# Patient Record
Sex: Male | Born: 1970 | Race: White | Hispanic: No | Marital: Single | Smoking: Current some day smoker
Health system: Southern US, Community
[De-identification: ages and names within clinical notes are randomized; demographics above are authoritative.]

## PROBLEM LIST (undated history)

## (undated) DIAGNOSIS — R112 Nausea with vomiting, unspecified: Secondary | ICD-10-CM

## (undated) DIAGNOSIS — T4145XA Adverse effect of unspecified anesthetic, initial encounter: Secondary | ICD-10-CM

## (undated) DIAGNOSIS — F419 Anxiety disorder, unspecified: Secondary | ICD-10-CM

## (undated) DIAGNOSIS — T8859XA Other complications of anesthesia, initial encounter: Secondary | ICD-10-CM

## (undated) DIAGNOSIS — Z9889 Other specified postprocedural states: Secondary | ICD-10-CM

## (undated) DIAGNOSIS — K219 Gastro-esophageal reflux disease without esophagitis: Secondary | ICD-10-CM

## (undated) HISTORY — PX: APPENDECTOMY: SHX54

---

## 1999-04-28 ENCOUNTER — Emergency Department (HOSPITAL_COMMUNITY): Admission: EM | Admit: 1999-04-28 | Discharge: 1999-04-28 | Payer: Self-pay | Admitting: Emergency Medicine

## 2001-09-04 ENCOUNTER — Emergency Department (HOSPITAL_COMMUNITY): Admission: EM | Admit: 2001-09-04 | Discharge: 2001-09-04 | Payer: Self-pay | Admitting: Emergency Medicine

## 2005-08-25 ENCOUNTER — Encounter (INDEPENDENT_AMBULATORY_CARE_PROVIDER_SITE_OTHER): Payer: Self-pay | Admitting: Specialist

## 2005-08-25 ENCOUNTER — Inpatient Hospital Stay (HOSPITAL_COMMUNITY): Admission: EM | Admit: 2005-08-25 | Discharge: 2005-08-27 | Payer: Self-pay | Admitting: Emergency Medicine

## 2009-01-22 ENCOUNTER — Encounter: Admission: RE | Admit: 2009-01-22 | Discharge: 2009-01-22 | Payer: Self-pay | Admitting: *Deleted

## 2010-05-27 NOTE — Op Note (Signed)
Brent Krueger, Brent Krueger                 ACCOUNT NO.:  [id number]   MEDICAL RECORD NO.:  [id number]          PATIENT TYPE:  INP   LOCATION:  0103                         FACILITY:  [facility]   PHYSICIAN:  Adolph Pollack, M.D.DATE OF BIRTH:  07/15/1970   DATE OF PROCEDURE:  08/25/2005  DATE OF DISCHARGE:                                 OPERATIVE REPORT   PREOPERATIVE DIAGNOSIS:  Acute appendicitis.   POSTOPERATIVE DIAGNOSIS:  Acute appendicitis.   PROCEDURE:  Laparoscopic appendectomy.   SURGEON:  Dr. Abbey Chatters   ANESTHESIA:  General.   INDICATIONS:  This 40 year old male has had abdominal pain for about 10  days, and it radiated to the right lower quadrant and became worse.  He was  found to have acute appendicitis on outpatient CT scan and has come to the  hospital, has been admitted, and is now going to be taken to the operating  room for a laparoscopic and possible open appendectomy.   TECHNIQUE:  He is brought to the room, placed supine on the operating table,  and general anesthetic was administered.  Foley catheter was placed in the  bladder.  The hair on the abdominal wall was clipped and the area sterilely  prepped and draped.  Dilute Marcaine solution was infiltrated in the  subumbilical region.  A subumbilical incision was made through the skin,  subcutaneous tissue, fascial layers, and peritoneum.  A pursestring suture  of 0 Vicryl was placed around the fascial edges.  A Hassan trocar was  introduced into the peritoneal cavity.  Pneumoperitoneum was created by  insufflation of CO2 gas.   Next, the laparoscope was introduced.  A 5 mm trocar was placed in the left  lower abdomen area.  Using blunt dissection, I noticed the appendix was  acutely inflamed, somewhat adherent to the right lower quadrant abdominal  wall, and I was able to free this up from the right lower quadrant abdominal  wall using blunt dissection.  The antimesenteric fat of the distal terminal  ileum  was also adherent to the appendix and using blunt dissection, I  separated these away.  I then was able to grasp the mesoappendix and retract  the appendix anteriorly.  I divided the mesoappendix down to the base of the  appendix with the harmonic scalpel.  I then retracted the appendix  anteriorly.  I amputated the appendix off the cecum with the Endo-GIA  stapler and then placed it into an Endopouch bag.  It was removed through  the subumbilical port.   The subumbilical trocar was then replaced.  I copiously irrigated out the  right lower quadrant.  Some bleeding from the staple line was noted, and  this was controlled with hemoclips.  I evacuated as much fluid as possible.  Once this was done, I removed the subumbilical Hassan trocar and closed the  fascial defect by tightening up and tying down the pursestring suture.  The  remaining trocars were removed and pneumoperitoneum released.   Skin incisions were closed with 4-0 Monocryl subcuticular stitches followed  by Steri-Strips and sterile dressings.  He tolerated  the procedure well  without any apparent complications and was taken to the recovery in  satisfactory condition.      Adolph Pollack, M.D.  Electronically Signed     TJR/MEDQ  D:  08/25/2005  T:  08/26/2005  Job:  [id number]

## 2010-05-27 NOTE — H&P (Signed)
Brent Krueger, PLANTZ                 ACCOUNT NO.:  [id number]   MEDICAL RECORD NO.:  [id number]          PATIENT TYPE:  INP   LOCATION:  0103                         FACILITY:  [facility]   PHYSICIAN:  Adolph Pollack, M.D.DATE OF BIRTH:  1970-11-14   DATE OF ADMISSION:  08/25/2005  DATE OF DISCHARGE:                                HISTORY & PHYSICAL   CHIEF COMPLAINT:  Increasing right lower quadrant pain.   HISTORY OF PRESENT ILLNESS:  This 40 year old male began having some  epigastric abdominal pain about 10 days ago.  It would come and go.  It  would radiate to his right lower quadrant.  Over the past two days, he is  having increased pain.  He denied nausea and vomiting.  In fact, he has been  eating.  He denies fevers or chills.  He went to [facility] for evaluation.  They sent him for an outpatient CT scan, and this is read as consistent with  acute appendicitis.  He was then sent to the emergency department, and I was  asked to evaluate him.   PAST MEDICAL HISTORY:  Gastroesophageal reflux disease.   PREVIOUS OPERATIONS:  None.   ALLERGIES:  None.   MEDICATIONS:  Prilosec.   SOCIAL HISTORY:  He is a former smoker.  Denies current alcohol use.  He is  a Visual merchandiser.   FAMILY HISTORY:  Notable for diabetes mellitus.   REVIEW OF SYSTEMS:  CARDIOVASCULAR:  No heart disease or hypertension.  PULMONARY:  No pneumonia or asthma.  GI:  No peptic ulcer disease,  hepatitis, colitis.  GU:  No kidney stones or urinary tract infections.  ENDOCRINE:  No diabetes or hypercholesterolemia.  NEUROLOGIC:  No seizures.  HEMATOLOGIC:  No bleeding disorders or deep venous thrombosis.   PHYSICAL EXAMINATION:  GENERAL:  An ill-appearing male.  VITAL SIGNS:  Temperature of 100.3.  Blood pressure 117/65.  Pulse 82.  HEENT:  Eyes:  Extraocular movements are intact.  No icterus.  NECK:  Supple without masses.  RESPIRATORY:  The breath sounds are clear and equal.  Respirations  unlabored.  CARDIOVASCULAR:  Regular rate, regular rhythm.  No murmur heard.  No JVD.  ABDOMEN:  Soft.  Slightly obese.  There is right lower quadrant tenderness  and gallbladder to both palpation and percussion.  No obvious masses noted.  MUSCULOSKELETAL:  He has good muscle tone.  Good range of motion.  SKIN:  No jaundice.   Laboratory data is notable for a normal urinalysis.  Wound care center is  11,900.   CT scan was reviewed and is consistent with acute appendicitis.   IMPRESSION:  Acute appendicitis.   PLAN:  Laparoscopic, possible appendectomy.  I have discussed the procedure  and the risks with him.  The risks include but are not limited to bleeding,  infection, wound healing problems, anesthesia, and accidental damage to  intra-abdominal.  He seems to understand this and agrees to proceed.      Adolph Pollack, M.D.  Electronically Signed     TJR/MEDQ  D:  08/25/2005  T:  08/25/2005  Job:  [id number]

## 2013-01-18 ENCOUNTER — Encounter (HOSPITAL_COMMUNITY): Payer: Self-pay | Admitting: Emergency Medicine

## 2013-01-18 ENCOUNTER — Emergency Department (HOSPITAL_COMMUNITY)
Admission: EM | Admit: 2013-01-18 | Discharge: 2013-01-18 | Disposition: A | Discharge status: Home / Self Care | Payer: 59 | Attending: Emergency Medicine | Admitting: Emergency Medicine

## 2013-01-18 ENCOUNTER — Emergency Department (HOSPITAL_COMMUNITY): Payer: 59

## 2013-01-18 DIAGNOSIS — S61311A Laceration without foreign body of left index finger with damage to nail, initial encounter: Secondary | ICD-10-CM

## 2013-01-18 DIAGNOSIS — S62609A Fracture of unspecified phalanx of unspecified finger, initial encounter for closed fracture: Secondary | ICD-10-CM

## 2013-01-18 DIAGNOSIS — F17200 Nicotine dependence, unspecified, uncomplicated: Secondary | ICD-10-CM | POA: Insufficient documentation

## 2013-01-18 DIAGNOSIS — Z79899 Other long term (current) drug therapy: Secondary | ICD-10-CM | POA: Insufficient documentation

## 2013-01-18 DIAGNOSIS — S61209A Unspecified open wound of unspecified finger without damage to nail, initial encounter: Secondary | ICD-10-CM | POA: Insufficient documentation

## 2013-01-18 DIAGNOSIS — IMO0002 Reserved for concepts with insufficient information to code with codable children: Secondary | ICD-10-CM | POA: Insufficient documentation

## 2013-01-18 DIAGNOSIS — Y939 Activity, unspecified: Secondary | ICD-10-CM | POA: Insufficient documentation

## 2013-01-18 DIAGNOSIS — Y929 Unspecified place or not applicable: Secondary | ICD-10-CM | POA: Insufficient documentation

## 2013-01-18 MED ORDER — HYDROCODONE-ACETAMINOPHEN 5-325 MG PO TABS: 1.0000 | ORAL_TABLET | ORAL | Status: DC | PRN

## 2013-01-18 MED ORDER — OXYCODONE-ACETAMINOPHEN 5-325 MG PO TABS: 1.0000 | ORAL_TABLET | ORAL | Status: DC | PRN

## 2013-01-18 NOTE — Progress Notes (Signed)
Orthopedic Tech Progress Note Patient Details:  Gearldine BienenstockMark L Guess Jun 04, 1970 [id number]  Ortho Devices Type of Ortho Device: Finger splint Ortho Device/Splint Location: LUE Ortho Device/Splint Interventions: Ordered;Application   Jennye MoccasinHughes, Charese Abundis Craig 01/18/2013, 5:25 PM

## 2013-01-18 NOTE — ED Provider Notes (Signed)
CSN: [id number]     Arrival date & time 01/18/13  1455 History  This chart was scribed for non-physician practitioner, Dierdre Forth, PA-C working with Candyce Churn, MD by Greggory Stallion, ED scribe. This patient was seen in room TR10C/TR10C and the patient's care was started at 4:38 PM.   Chief Complaint  Patient presents with  . Hand Injury   The history is provided by the patient. No language interpreter was used.   HPI Comments: Brent Krueger is a 43 y.o. male who presents to the Emergency Department complaining of left hand injury that occurred about 2 hours ago. He states he smashed his left second finger with a claw hammer. Pt has sudden onset left hand pain with associated swelling and abrasions to his hand. Rates the pain 8/10. He states he can not bend his finger due to pain. States his last tetanus was 2 weeks ago.   History reviewed. No pertinent past medical history. History reviewed. No pertinent past surgical history. History reviewed. No pertinent family history. History  Substance Use Topics  . Smoking status: Current Some Day Smoker  . Smokeless tobacco: Not on file  . Alcohol Use: Yes    Review of Systems  Constitutional: Negative for fever, diaphoresis, appetite change, fatigue and unexpected weight change.  HENT: Negative for mouth sores.   Eyes: Negative for visual disturbance.  Respiratory: Negative for cough, chest tightness, shortness of breath and wheezing.   Cardiovascular: Negative for chest pain.  Gastrointestinal: Negative for nausea, vomiting, abdominal pain, diarrhea and constipation.  Endocrine: Negative for polydipsia, polyphagia and polyuria.  Genitourinary: Negative for dysuria, urgency, frequency and hematuria.  Musculoskeletal: Positive for arthralgias and joint swelling. Negative for back pain and neck stiffness.  Skin: Positive for wound. Negative for rash.  Allergic/Immunologic: Negative for immunocompromised state.  Neurological:  Negative for syncope, light-headedness and headaches.  Hematological: Does not bruise/bleed easily.  Psychiatric/Behavioral: Negative for sleep disturbance. The patient is not nervous/anxious.     Allergies  Review of patient's allergies indicates no known allergies.  Home Medications   Current Outpatient Rx  Name  Route  Sig  Dispense  Refill  . dexlansoprazole (DEXILANT) 60 MG capsule   Oral   Take 60 mg by mouth daily.         . mometasone (NASONEX) 50 MCG/ACT nasal spray   Nasal   Place 2 sprays into the nose daily.         Marland Kitchen PARoxetine (PAXIL) 10 MG tablet   Oral   Take 10 mg by mouth every evening.         . ranitidine (ZANTAC) 150 MG tablet   Oral   Take 150 mg by mouth daily as needed for heartburn.         Marland Kitchen oxyCODONE-acetaminophen (PERCOCET/ROXICET) 5-325 MG per tablet   Oral   Take 1-2 tablets by mouth every 4 (four) hours as needed for severe pain.   30 tablet   0    BP 132/84  Pulse 70  Temp(Src) 98.6 F (37 C) (Oral)  Resp 16  Wt 209 lb 14.4 oz (95.21 kg)  SpO2 100%  Physical Exam  Nursing note and vitals reviewed. Constitutional: He appears well-developed and well-nourished. No distress.  HENT:  Head: Normocephalic and atraumatic.  Eyes: Conjunctivae are normal.  Neck: Normal range of motion.  Cardiovascular: Normal rate, regular rhythm and intact distal pulses.   Capillary refill less than 3 seconds.   Pulmonary/Chest: Effort normal and breath  sounds normal.  Musculoskeletal: He exhibits tenderness. He exhibits no edema.  ROM: full ROM of MCP but minimal ROM of DIP and PIP Swelling of entire finger.  Neurological: He is alert. Coordination normal.  Sensation intact Strength 5/5 with resistance flexion and extension at the MCP joint. Minimal strength at other joints. Strong grip strength.   Skin: Skin is warm and dry. He is not diaphoretic.  No tenting of the skin. Two 0.5 cm lacerations to the medial side of the left pointer finger.    Psychiatric: He has a normal mood and affect.    ED Course  Irrigation and debridement Date/Time: 01/18/2013 5:16 PM Performed by: Dierdre Forth Authorized by: Dierdre Forth Consent: Verbal consent obtained. Risks and benefits: risks, benefits and alternatives were discussed Consent given by: patient Patient understanding: patient states understanding of the procedure being performed Patient consent: the patient's understanding of the procedure matches consent given Procedure consent: procedure consent matches procedure scheduled Relevant documents: relevant documents present and verified Site marked: the operative site was marked Imaging studies: imaging studies available Required items: required blood products, implants, devices, and special equipment available Patient identity confirmed: verbally with patient and arm band Preparation: Patient was prepped and draped in the usual sterile fashion. Local anesthesia used: yes Anesthesia: digital block Local anesthetic: lidocaine 2% without epinephrine Anesthetic total: 5 ml Patient sedated: no Patient tolerance: Patient tolerated the procedure well with no immediate complications. Comments: Irrigation and iodine scrub of the small lacerations to the left pointer finger without complication   (including critical care time)  DIAGNOSTIC STUDIES: Oxygen Saturation is 100% on RA, normal by my interpretation.    COORDINATION OF CARE: 4:41 PM-Discussed treatment plan which includes a digital block to his left index finger with pt at bedside and pt agreed to plan.   Labs Review Labs Reviewed - No data to display Imaging Review Dg Finger Index Left  01/18/2013   CLINICAL DATA:  Traumatic injury with pain  EXAM: LEFT INDEX FINGER 2+V  COMPARISON:  None.  FINDINGS: There is a an oblique fracture through the 2nd middle phalanx with mild displacement. Involves the proximal PIP joint. No other fractures are seen.  IMPRESSION:  Fracture through the 2nd middle phalanx with mild displacement. Involvement of the PIP joint is noted.   Electronically Signed   By: Alcide Clever M.D.   On: 01/18/2013 15:56    EKG Interpretation   None       MDM   1. Finger fracture, left, closed, initial encounter   2. Laceration of left index finger w/o foreign body with damage to nail, initial encounter     Brent Krueger presents with finger injury and small, superficial lacerations which are not saturable. Patient X-Ray with Fracture through the 2nd middle phalanx with mild displacement. Involvement of the PIP joint is noted. Pain managed in ED with a digital block and wounds were cleaned under anesthesia.  2 very small and superficial lacerations noted to the medial side of the finger which did not require suturing.  After cleaning there is no indication that these are open fractures.   Pt advised to follow up with hand orthopedics for further evaluation and treatment.  Pain managed in the department. Patient given static finger splint while in ED, conservative therapy recommended and discussed. Patient will be dc home & is agreeable with above plan. I have also discussed reasons to return immediately to the ER.  Patient expresses understanding and agrees with plan.  It  has been determined that no acute conditions requiring further emergency intervention are present at this time. The patient/guardian have been advised of the diagnosis and plan. We have discussed signs and symptoms that warrant return to the ED, such as changes or worsening in symptoms.   Vital signs are stable at discharge.   BP 132/84  Pulse 70  Temp(Src) 98.6 F (37 C) (Oral)  Resp 16  Wt 209 lb 14.4 oz (95.21 kg)  SpO2 100%  Patient/guardian has voiced understanding and agreed to follow-up with the PCP or specialist.   I personally performed the services described in this documentation, which was scribed in my presence. The recorded information has been  reviewed and is accurate.    Dierdre ForthHannah Rodger Giangregorio, PA-C 01/18/13 1717  Dierdre ForthHannah Jammie Troup, PA-C 01/18/13 1905

## 2013-01-18 NOTE — Discharge Instructions (Signed)
1. Medications: percocet for pain control, usual home medications 2. Treatment: rest, drink plenty of fluids, keep splint dry, use ice for swelling, keep arm elevated above your heart 3. Follow Up: Please followup with Dr. Mina MarbleWeingold for further evaluation and treatment of your finger fracture   Finger Fracture Fractures of fingers are breaks in the bones of the fingers. There are many types of fractures. There are different ways of treating these fractures. Your health care provider will discuss the best way to treat your fracture. CAUSES Traumatic injury is the main cause of broken fingers. These include:  Injuries while playing sports.  Workplace injuries.  Falls. RISK FACTORS Activities that can increase your risk of finger fractures include:  Sports.  Workplace activities that involve machinery.  A condition called osteoporosis, which can make your bones less dense and cause them to fracture more easily. SIGNS AND SYMPTOMS The main symptoms of a broken finger are pain and swelling within 15 minutes after the injury. Other symptoms include:  Bruising of your finger.  Stiffness of your finger.  Numbness of your finger.  Exposed bones (compound fracture) if the fracture is severe. DIAGNOSIS  The best way to diagnose a broken bone is with X-ray imaging. Additionally, your health care provider will use this X-ray image to evaluate the position of the broken finger bones.  TREATMENT  Finger fractures can be treated with:   Nonreduction This means the bones are in place. The finger is splinted without changing the positions of the bone pieces. The splint is usually left on for about a week to 10 days. This will depend on your fracture and what your health care provider thinks.  Closed reduction The bones are put back into position without using surgery. The finger is then splinted.  Open reduction and internal fixation The fracture site is opened. Then the bone pieces are fixed  into place with pins or some type of hardware. This is seldom required. It depends on the severity of the fracture. HOME CARE INSTRUCTIONS   Follow your health care provider's instructions regarding activities, exercises, and physical therapy.  Only take over-the-counter or prescription medicines for pain, discomfort, or fever as directed by your health care provider. SEEK MEDICAL CARE IF: You have pain or swelling that limits the motion or use of your fingers. SEEK IMMEDIATE MEDICAL CARE IF:  Your finger becomes numb. MAKE SURE YOU:   Understand these instructions.  Will watch your condition.  Will get help right away if you are not doing well or get worse. Document Released: 04/09/2000 Document Revised: 10/16/2012 Document Reviewed: 08/07/2012 [facility] LP[facility] Patient Information 2014 [place], [place].

## 2013-01-18 NOTE — ED Notes (Signed)
Pt reports he smashed L 2nd finger with a hammer just pta. Open wounds to side of digits with no active bleeding. States it is too painful to wiggle the digit.

## 2013-01-19 NOTE — ED Provider Notes (Signed)
Medical screening examination/treatment/procedure(s) were performed by non-physician practitioner and as supervising physician I was immediately available for consultation/collaboration.  EKG Interpretation   None         Candyce ChurnJohn David Abrea Henle, MD 01/19/13 [phone]

## 2013-01-21 ENCOUNTER — Other Ambulatory Visit (HOSPITAL_COMMUNITY): Payer: Self-pay | Admitting: Orthopedic Surgery

## 2013-01-21 ENCOUNTER — Encounter (HOSPITAL_COMMUNITY): Payer: Self-pay | Admitting: *Deleted

## 2013-01-21 ENCOUNTER — Encounter (HOSPITAL_COMMUNITY): Payer: Self-pay | Admitting: Pharmacy Technician

## 2013-01-22 ENCOUNTER — Encounter (HOSPITAL_COMMUNITY)
Admission: RE | Disposition: A | Discharge status: Home / Self Care | Payer: Self-pay | Source: Ambulatory Visit | Attending: Orthopedic Surgery

## 2013-01-22 ENCOUNTER — Ambulatory Visit (HOSPITAL_COMMUNITY): Payer: 59 | Admitting: Critical Care Medicine

## 2013-01-22 ENCOUNTER — Ambulatory Visit (HOSPITAL_COMMUNITY)
Admission: RE | Admit: 2013-01-22 | Discharge: 2013-01-22 | Disposition: A | Discharge status: Home / Self Care | Payer: 59 | Source: Ambulatory Visit | Attending: Orthopedic Surgery | Admitting: Orthopedic Surgery

## 2013-01-22 ENCOUNTER — Encounter (HOSPITAL_COMMUNITY): Payer: Self-pay | Admitting: *Deleted

## 2013-01-22 ENCOUNTER — Encounter (HOSPITAL_COMMUNITY): Payer: 59 | Admitting: Critical Care Medicine

## 2013-01-22 DIAGNOSIS — K219 Gastro-esophageal reflux disease without esophagitis: Secondary | ICD-10-CM | POA: Insufficient documentation

## 2013-01-22 DIAGNOSIS — S62629A Displaced fracture of medial phalanx of unspecified finger, initial encounter for closed fracture: Secondary | ICD-10-CM

## 2013-01-22 DIAGNOSIS — W230XXA Caught, crushed, jammed, or pinched between moving objects, initial encounter: Secondary | ICD-10-CM | POA: Insufficient documentation

## 2013-01-22 DIAGNOSIS — F17200 Nicotine dependence, unspecified, uncomplicated: Secondary | ICD-10-CM | POA: Insufficient documentation

## 2013-01-22 DIAGNOSIS — IMO0002 Reserved for concepts with insufficient information to code with codable children: Secondary | ICD-10-CM | POA: Insufficient documentation

## 2013-01-22 HISTORY — DX: Anxiety disorder, unspecified: F41.9

## 2013-01-22 HISTORY — DX: Nausea with vomiting, unspecified: Z98.890

## 2013-01-22 HISTORY — DX: Adverse effect of unspecified anesthetic, initial encounter: T41.45XA

## 2013-01-22 HISTORY — DX: Gastro-esophageal reflux disease without esophagitis: K21.9

## 2013-01-22 HISTORY — PX: OPEN REDUCTION INTERNAL FIXATION (ORIF) DISTAL PHALANX: SHX6236

## 2013-01-22 HISTORY — DX: Nausea with vomiting, unspecified: R11.2

## 2013-01-22 HISTORY — DX: Other complications of anesthesia, initial encounter: T88.59XA

## 2013-01-22 LAB — CBC
HEMATOCRIT: 45.5 % (ref 39.0–52.0)
Hemoglobin: 16.4 g/dL (ref 13.0–17.0)
MCH: 31.1 pg (ref 26.0–34.0)
MCHC: 36 g/dL (ref 30.0–36.0)
MCV: 86.2 fL (ref 78.0–100.0)
PLATELETS: 224 10*3/uL (ref 150–400)
RBC: 5.28 MIL/uL (ref 4.22–5.81)
RDW: 13.1 % (ref 11.5–15.5)
WBC: 5.2 10*3/uL (ref 4.0–10.5)

## 2013-01-22 LAB — PROTIME-INR
INR: 1.09 (ref 0.00–1.49)
PROTHROMBIN TIME: 13.9 s (ref 11.6–15.2)

## 2013-01-22 SURGERY — OPEN REDUCTION INTERNAL FIXATION (ORIF) DISTAL PHALANX
Anesthesia: General | Site: Finger | Laterality: Left

## 2013-01-22 MED ORDER — PROPOFOL 10 MG/ML IV BOLUS
INTRAVENOUS | Status: DC | PRN
  Administered 2013-01-22: 200 mg via INTRAVENOUS

## 2013-01-22 MED ORDER — ROPIVACAINE HCL 5 MG/ML IJ SOLN
INTRAMUSCULAR | Status: DC | PRN
Start: 2013-01-22 — End: 2013-01-22
  Administered 2013-01-22: 20 mL via PERINEURAL

## 2013-01-22 MED ORDER — CEFAZOLIN SODIUM-DEXTROSE 2-3 GM-% IV SOLR
2.0000 g | INTRAVENOUS | Status: AC
  Administered 2013-01-22: 2 g via INTRAVENOUS
  Filled 2013-01-22: qty 50

## 2013-01-22 MED ORDER — ONDANSETRON 8 MG PO TBDP
8.0000 mg | ORAL_TABLET | Freq: Once | ORAL | Status: AC
  Administered 2013-01-22: 8 mg via ORAL
  Filled 2013-01-22 (×2): qty 1

## 2013-01-22 MED ORDER — OXYCODONE HCL 5 MG PO TABS
ORAL_TABLET | ORAL | Status: AC
  Filled 2013-01-22: qty 1

## 2013-01-22 MED ORDER — ONDANSETRON HCL 4 MG/2ML IJ SOLN: 4.0000 mg | Freq: Once | INTRAMUSCULAR | Status: DC | PRN

## 2013-01-22 MED ORDER — HYDROMORPHONE HCL PF 1 MG/ML IJ SOLN
INTRAMUSCULAR | Status: AC
  Administered 2013-01-22: 0.5 mg via INTRAVENOUS
  Filled 2013-01-22: qty 2

## 2013-01-22 MED ORDER — LACTATED RINGERS IV SOLN
INTRAVENOUS | Status: DC
  Administered 2013-01-22: 09:00:00 via INTRAVENOUS

## 2013-01-22 MED ORDER — ACETAMINOPHEN 10 MG/ML IV SOLN
INTRAVENOUS | Status: AC
  Administered 2013-01-22: 1000 mg
  Filled 2013-01-22: qty 100

## 2013-01-22 MED ORDER — OXYCODONE HCL 5 MG PO TABS
5.0000 mg | ORAL_TABLET | Freq: Once | ORAL | Status: AC | PRN
  Administered 2013-01-22: 5 mg via ORAL

## 2013-01-22 MED ORDER — 0.9 % SODIUM CHLORIDE (POUR BTL) OPTIME
TOPICAL | Status: DC | PRN
  Administered 2013-01-22: 1000 mL

## 2013-01-22 MED ORDER — OXYCODONE HCL 5 MG/5ML PO SOLN: 5.0000 mg | Freq: Once | ORAL | Status: AC | PRN

## 2013-01-22 MED ORDER — ONDANSETRON HCL 4 MG/2ML IJ SOLN
INTRAMUSCULAR | Status: DC | PRN
  Administered 2013-01-22: 4 mg via INTRAVENOUS

## 2013-01-22 MED ORDER — DEXAMETHASONE SODIUM PHOSPHATE 4 MG/ML IJ SOLN
INTRAMUSCULAR | Status: DC | PRN
  Administered 2013-01-22: 4 mg via INTRAVENOUS

## 2013-01-22 MED ORDER — FENTANYL CITRATE 0.05 MG/ML IJ SOLN
INTRAMUSCULAR | Status: DC | PRN
  Administered 2013-01-22 (×2): 25 ug via INTRAVENOUS
  Administered 2013-01-22: 50 ug via INTRAVENOUS

## 2013-01-22 MED ORDER — MIDAZOLAM HCL 5 MG/5ML IJ SOLN
INTRAMUSCULAR | Status: DC | PRN
  Administered 2013-01-22: 2 mg via INTRAVENOUS

## 2013-01-22 MED ORDER — KETOROLAC TROMETHAMINE 30 MG/ML IJ SOLN
INTRAMUSCULAR | Status: AC
  Administered 2013-01-22: 30 mg
  Filled 2013-01-22: qty 1

## 2013-01-22 MED ORDER — LIDOCAINE HCL (CARDIAC) 20 MG/ML IV SOLN
INTRAVENOUS | Status: DC | PRN
  Administered 2013-01-22: 100 mg via INTRAVENOUS

## 2013-01-22 MED ORDER — HYDROMORPHONE HCL PF 1 MG/ML IJ SOLN
0.2500 mg | INTRAMUSCULAR | Status: DC | PRN
  Administered 2013-01-22 (×4): 0.5 mg via INTRAVENOUS

## 2013-01-22 SURGICAL SUPPLY — 49 items
BANDAGE COBAN STERILE 2 (GAUZE/BANDAGES/DRESSINGS) ×1 IMPLANT
BANDAGE CONFORM 3  STR LF (GAUZE/BANDAGES/DRESSINGS) ×1 IMPLANT
BANDAGE GAUZE ELAST BULKY 4 IN (GAUZE/BANDAGES/DRESSINGS) IMPLANT
BIT DRILL QC 1.1MM (BIT) IMPLANT
BNDG CMPR 9X4 STRL LF SNTH (GAUZE/BANDAGES/DRESSINGS) ×1
BNDG COHESIVE 4X5 TAN STRL (GAUZE/BANDAGES/DRESSINGS) ×1 IMPLANT
BNDG ESMARK 4X9 LF (GAUZE/BANDAGES/DRESSINGS) ×2 IMPLANT
CLOTH BEACON ORANGE TIMEOUT ST (SAFETY) ×1 IMPLANT
COVER SURGICAL LIGHT HANDLE (MISCELLANEOUS) ×2 IMPLANT
CUFF TOURNIQUET SINGLE 18IN (TOURNIQUET CUFF) ×2 IMPLANT
CUFF TOURNIQUET SINGLE 24IN (TOURNIQUET CUFF) IMPLANT
DRAPE OEC MINIVIEW 54X84 (DRAPES) ×2 IMPLANT
DRAPE U-SHAPE 47X51 STRL (DRAPES) ×2 IMPLANT
DRSG EMULSION OIL 3X3 NADH (GAUZE/BANDAGES/DRESSINGS) ×1 IMPLANT
DURAPREP 26ML APPLICATOR (WOUND CARE) ×2 IMPLANT
ELECT REM PT RETURN 9FT ADLT (ELECTROSURGICAL) ×2
ELECTRODE REM PT RTRN 9FT ADLT (ELECTROSURGICAL) ×1 IMPLANT
GLOVE BIOGEL PI IND STRL 7.5 (GLOVE) IMPLANT
GLOVE BIOGEL PI IND STRL 9 (GLOVE) ×1 IMPLANT
GLOVE BIOGEL PI INDICATOR 7.5 (GLOVE) ×1
GLOVE BIOGEL PI INDICATOR 9 (GLOVE) ×1
GLOVE SURG ORTHO 9.0 STRL STRW (GLOVE) ×2 IMPLANT
GLOVE SURG SS PI 7.0 STRL IVOR (GLOVE) ×1 IMPLANT
GOWN L4 LG 24 PK N/S (GOWN DISPOSABLE) ×1 IMPLANT
GOWN L4 XLG 20 PK N/S (GOWN DISPOSABLE) ×2 IMPLANT
GOWN SRG XL XLNG 56XLVL 4 (GOWN DISPOSABLE) ×2 IMPLANT
GOWN STRL NON-REIN XL XLG LVL4 (GOWN DISPOSABLE)
KIT BASIN OR (CUSTOM PROCEDURE TRAY) ×2 IMPLANT
KIT ROOM TURNOVER OR (KITS) ×2 IMPLANT
MANIFOLD NEPTUNE II (INSTRUMENTS) ×1 IMPLANT
NS IRRIG 1000ML POUR BTL (IV SOLUTION) ×2 IMPLANT
PACK ORTHO EXTREMITY (CUSTOM PROCEDURE TRAY) ×2 IMPLANT
PAD ARMBOARD 7.5X6 YLW CONV (MISCELLANEOUS) ×4 IMPLANT
PAD CAST 4YDX4 CTTN HI CHSV (CAST SUPPLIES) IMPLANT
PADDING CAST COTTON 4X4 STRL (CAST SUPPLIES)
PLATE-T 2.0 2H/18 (Orthopedic Implant) ×1 IMPLANT
PROS DRILL BIT QC 1.1MM (BIT) ×2
SCREW CORTEX 1.5X14 (Screw) ×1 IMPLANT
SCREW CORTEX 1.5X8 (Screw) ×3 IMPLANT
SCREW CORTEX 1.5X9 (Screw) ×1 IMPLANT
SPLINT FINGER 5.25 W/BULB ALUM (SOFTGOODS) ×1 IMPLANT
SPONGE GAUZE 4X4 12PLY (GAUZE/BANDAGES/DRESSINGS) ×1 IMPLANT
SUT PROLENE 3 0 PS 1 (SUTURE) IMPLANT
SUT VIC AB 3-0 X1 27 (SUTURE) IMPLANT
SUT VICRYL 4-0 PS2 18IN ABS (SUTURE) ×1 IMPLANT
TOWEL OR 17X24 6PK STRL BLUE (TOWEL DISPOSABLE) ×2 IMPLANT
TOWEL OR 17X26 10 PK STRL BLUE (TOWEL DISPOSABLE) ×2 IMPLANT
TUBE CONNECTING 12X1/4 (SUCTIONS) ×1 IMPLANT
WATER STERILE IRR 1000ML POUR (IV SOLUTION) ×1 IMPLANT

## 2013-01-22 NOTE — Anesthesia Postprocedure Evaluation (Addendum)
  Anesthesia Post-op Note  Patient: Gearldine BienenstockMark L Holbein  Procedure(s) Performed: Procedure(s) with comments: OPEN REDUCTION INTERNAL FIXATION (ORIF) DISTAL PHALANX (Left) - Open Reduction Internal Fixation Left Index Finger Middle Phalanx  Patient Location: PACU  Anesthesia Type:General and Regional (post op supraclavicular block placed in setting of uncontrolled post op pain, motor/sensory block obtained and pain controlled)  Level of Consciousness: awake, alert  and oriented  Airway and Oxygen Therapy: Patient Spontanous Breathing  Post-op Pain: none  Post-op Assessment: Post-op Vital signs reviewed, Patient's Cardiovascular Status Stable, Respiratory Function Stable, Patent Airway, No signs of Nausea or vomiting and Pain level controlled  Post-op Vital Signs: Reviewed and stable  Complications: No apparent anesthesia complications

## 2013-01-22 NOTE — Progress Notes (Signed)
Contacted Dr. Maple HudsonMoser for uncontrolled pain in finger.  Patient states pain is 8.5/10.  Dr. Maple HudsonMoser ordered 30mg  of toradol and 1000 mg of Ofirmev to be given to the patient, which it was.  Dr. Maple HudsonMoser at bedside to place arm block in left arm.  Patient prepped by Dr. Maple HudsonMoser and given nerve block.  5 minutes after the procedure, patient reports 0/10.

## 2013-01-22 NOTE — Anesthesia Preprocedure Evaluation (Addendum)
Anesthesia Evaluation  Patient identified by MRN, date of birth, ID band Patient awake    Reviewed: Allergy & Precautions, H&P , NPO status , Patient's Chart, lab work & pertinent test results  History of Anesthesia Complications (+) PONV  Airway Mallampati: II TM Distance: >3 FB Neck ROM: Full    Dental  (+) Dental Advisory Given   Pulmonary Current Smoker,    Pulmonary exam normal       Cardiovascular Exercise Tolerance: Good METS: 5 - 7 Mets - angina- CHF negative cardio ROS  - Valvular Problems/MurmursRhythm:Regular Rate:Normal     Neuro/Psych PSYCHIATRIC DISORDERS Anxiety negative neurological ROS     GI/Hepatic Neg liver ROS, GERD-  Medicated,  Endo/Other  negative endocrine ROS  Renal/GU Cr 1.3 on routine labs, no previous h/o renal insufficiency, patient advised to follow up with primary care physician.   negative genitourinary   Musculoskeletal   Abdominal   Peds  Hematology negative hematology ROS (+)   Anesthesia Other Findings   Reproductive/Obstetrics                         Anesthesia Physical Anesthesia Plan  ASA: II  Anesthesia Plan: General   Post-op Pain Management:    Induction: Intravenous  Airway Management Planned: LMA  Additional Equipment: None  Intra-op Plan:   Post-operative Plan: Extubation in OR  Informed Consent: I have reviewed the patients History and Physical, chart, labs and discussed the procedure including the risks, benefits and alternatives for the proposed anesthesia with the patient or authorized representative who has indicated his/her understanding and acceptance.   Dental advisory given  Plan Discussed with: Surgeon and CRNA  Anesthesia Plan Comments:        Anesthesia Quick Evaluation

## 2013-01-22 NOTE — Anesthesia Procedure Notes (Signed)
Anesthesia Regional Block:  Supraclavicular block  Pre-Anesthetic Checklist: ,, timeout performed, Correct Patient, Correct Site, Correct Laterality, Correct Procedure, Correct Position, site marked, Risks and benefits discussed,  Surgical consent,  Pre-op evaluation,  At surgeon's request and post-op pain management  Laterality: Left and Upper  Prep: chloraprep       Needles:  Injection technique: Single-shot  Needle Type: Stimiplex          Additional Needles:  Procedures: ultrasound guided (picture in chart) Supraclavicular block Narrative:  Start time: 01/22/2013 12:02 PM End time: 01/22/2013 12:14 PM Injection made incrementally with aspirations every 5 mL.  Performed by: Personally  Anesthesiologist: Shaylie Eklund

## 2013-01-22 NOTE — Op Note (Signed)
OPERATIVE REPORT  DATE OF SURGERY: 01/22/2013  PATIENT:  Brent Krueger,  43 y.o. male  PRE-OPERATIVE DIAGNOSIS:  Left Index Finger Middle Phalanx Fracture  POST-OPERATIVE DIAGNOSIS:  Left Index Finger Middle Phalanx Fracture T. condylar with intra-articular extension into the PIP joint  PROCEDURE:  Procedure(s): OPEN REDUCTION INTERNAL FIXATION (ORIF) DISTAL PHALANX with a T. plate  SURGEON:  Surgeon(s): Nadara MustardMarcus V Duda, MD  ANESTHESIA:   general  EBL:  min ML  SPECIMEN:  No Specimen  TOURNIQUET:   Total Tourniquet Time Documented: Forearm (Left) - 32 minutes Total: Forearm (Left) - 32 minutes   PROCEDURE DETAILS: Patient is a [age] year old gentleman who sustained a closed crush injury to the middle phalanx left index finger from hitting his finger with a hammer. Patient presents at this time for open reduction internal fixation with intra-articular fracture the PIP joint of the middle phalanx with a large extension and distal extension of the fracture line. Risks and benefits of surgery were discussed including infection neurovascular injury stiffness of the finger loss of range of motion. Patient states he understands and wished to proceed at this time. Description of procedure patient was brought to the operating room underwent a general anesthetic after adequate levels and anesthesia obtained patient's left upper extremity was prepped using DuraPrep draped in a sterile field. A timeout was called patient received preoperative antibiotics. A Z. incision was made dorsally over the index finger extending ulnarly at the PIP joint and coming radially at the DIP joint. Blunt dissection was carried down to the extensor mechanism. The extensor mechanism and retinaculum were incised longitudinally. The fracture site was freshened cleansed the intra-articular fracture was reduced this was stabilized with a mini fragment T. plate. 2 screws were placed in the medial lateral condyle of the middle  phalanx and 2 screws were placed distally. An interfrag screw was then placed radial to ulnar to stabilize the longitudinal split of the shaft fracture. C-arm fluoroscopy verified reduction in AP and lateral planes. The wound was irrigated. The extensor retinaculum was repaired using 2-0 FiberWire. Patient had good resting flexion the finger after reduction of the excess extensor mechanism. The incision was closed using 3-0 nylon. The wound was covered with Adaptic orthopedic sponges a volar splint was placed to keep the finger in extension to protect the extensor mechanism repair a sterile dressing was then applied patient was extubated taken to the PACU in stable condition plan for discharge to home.  PLAN OF CARE: Discharge to home after PACU  PATIENT DISPOSITION:  PACU - hemodynamically stable.   Nadara MustardUDA,MARCUS V, MD 01/22/2013 10:50 AM

## 2013-01-22 NOTE — Transfer of Care (Signed)
Immediate Anesthesia Transfer of Care Note  Patient: Brent Krueger  Procedure(s) Performed: Procedure(s) with comments: OPEN REDUCTION INTERNAL FIXATION (ORIF) DISTAL PHALANX (Left) - Open Reduction Internal Fixation Left Index Finger Middle Phalanx  Patient Location: PACU  Anesthesia Type:General  Level of Consciousness: awake, alert  and oriented  Airway & Oxygen Therapy: Patient Spontanous Breathing and Patient connected to nasal cannula oxygen  Post-op Assessment: Report given to PACU RN, Post -op Vital signs reviewed and stable and Patient moving all extremities X 4  Post vital signs: Reviewed and stable  Complications: No apparent anesthesia complications

## 2013-01-22 NOTE — H&P (Signed)
Brent BienenstockMark L Krueger is an 43 y.o. male.   Chief Complaint: Crush injury left index finger with closed middle phalangeal fracture HPI: Patient is a 43 year old gentleman who struck his left index finger with a hammer sustaining a displaced closed fracture of the middle phalanx left index finger involving the PIP joint.  Past Medical History  Diagnosis Date  . GERD (gastroesophageal reflux disease)   . Complication of anesthesia   . PONV (postoperative nausea and vomiting)   . Anxiety     Past Surgical History  Procedure Laterality Date  . Appendectomy      History reviewed. No pertinent family history. Social History:  reports that he has been smoking.  He does not have any smokeless tobacco history on file. He reports that he drinks alcohol. He reports that he uses illicit drugs (Marijuana).  Allergies: No Known Allergies  No prescriptions prior to admission    No results found for this or any previous visit (from the past 48 hour(s)). No results found.  Review of Systems  All other systems reviewed and are negative.    There were no vitals taken for this visit. Physical Exam  On examination patient has multiple abrasions on the index finger radiograph shows displaced fracture of the middle phalanx. Assessment/Plan Assessment: Closed middle phalanx fracture left index finger involving the PIP joint with displacement the joint.  Plan: We'll plan for open reduction internal fixation. Risks and benefits were discussed including arthritis stiffness need for additional surgery risk of neurovascular injury. Patient states he understands and wishes to pursue surgery at this time.  DUDA,MARCUS V 01/22/2013, 6:26 AM

## 2013-01-22 NOTE — Preoperative (Signed)
Beta Blockers   Reason not to administer Beta Blockers:Not Applicable 

## 2013-01-22 NOTE — Discharge Instructions (Signed)
Elevate left upper extremity above the heart. Keep dressing clean and dry and intact until followup   What to eat:  For your first meals, you should eat lightly; only small meals initially.  If you do not have nausea, you may eat larger meals.  Avoid spicy, greasy and heavy food.    General Anesthesia, Adult, Care After  Refer to this sheet in the next few weeks. These instructions provide you with information on caring for yourself after your procedure. Your health care provider may also give you more specific instructions. Your treatment has been planned according to current medical practices, but problems sometimes occur. Call your health care provider if you have any problems or questions after your procedure.  WHAT TO EXPECT AFTER THE PROCEDURE  After the procedure, it is typical to experience:  Sleepiness.  Nausea and vomiting. HOME CARE INSTRUCTIONS  For the first 24 hours after general anesthesia:  Have a responsible person with you.  Do not drive a car. If you are alone, do not take public transportation.  Do not drink alcohol.  Do not take medicine that has not been prescribed by your health care provider.  Do not sign important papers or make important decisions.  You may resume a normal diet and activities as directed by your health care provider.  Change bandages (dressings) as directed.  If you have questions or problems that seem related to general anesthesia, call the hospital and ask for the anesthetist or anesthesiologist on call. SEEK MEDICAL CARE IF:  You have nausea and vomiting that continue the day after anesthesia.  You develop a rash. SEEK IMMEDIATE MEDICAL CARE IF:  You have difficulty breathing.  You have chest pain.  You have any allergic problems. Document Released: 04/03/2000 Document Revised: 08/28/2012 Document Reviewed: 07/11/2012  [facility] Patient Information 2014 [employer], [place].

## 2013-01-24 ENCOUNTER — Encounter (HOSPITAL_COMMUNITY): Payer: Self-pay | Admitting: Orthopedic Surgery

## 2013-01-31 NOTE — Addendum Note (Signed)
Addendum created 01/31/13 1117 by Corky Soxhris Denyce Harr, MD   Modules edited: Anesthesia Attestations

## 2013-06-27 ENCOUNTER — Encounter (HOSPITAL_COMMUNITY): Payer: Self-pay | Admitting: Emergency Medicine

## 2013-06-27 ENCOUNTER — Emergency Department (HOSPITAL_COMMUNITY)
Admission: EM | Admit: 2013-06-27 | Discharge: 2013-06-27 | Disposition: A | Discharge status: Home / Self Care | Payer: 59 | Attending: Emergency Medicine | Admitting: Emergency Medicine

## 2013-06-27 DIAGNOSIS — K219 Gastro-esophageal reflux disease without esophagitis: Secondary | ICD-10-CM | POA: Insufficient documentation

## 2013-06-27 DIAGNOSIS — F411 Generalized anxiety disorder: Secondary | ICD-10-CM | POA: Insufficient documentation

## 2013-06-27 DIAGNOSIS — F17200 Nicotine dependence, unspecified, uncomplicated: Secondary | ICD-10-CM | POA: Insufficient documentation

## 2013-06-27 DIAGNOSIS — M545 Low back pain, unspecified: Secondary | ICD-10-CM | POA: Insufficient documentation

## 2013-06-27 DIAGNOSIS — IMO0002 Reserved for concepts with insufficient information to code with codable children: Secondary | ICD-10-CM | POA: Insufficient documentation

## 2013-06-27 DIAGNOSIS — Z79899 Other long term (current) drug therapy: Secondary | ICD-10-CM | POA: Insufficient documentation

## 2013-06-27 MED ORDER — PREDNISONE 20 MG PO TABS: 40.0000 mg | ORAL_TABLET | Freq: Every day | ORAL | Status: AC

## 2013-06-27 MED ORDER — HYDROCODONE-ACETAMINOPHEN 5-325 MG PO TABS
1.0000 | ORAL_TABLET | Freq: Once | ORAL | Status: AC
Start: 2013-06-27 — End: 2013-06-27
  Administered 2013-06-27: 1 via ORAL
  Filled 2013-06-27: qty 1

## 2013-06-27 MED ORDER — DIAZEPAM 5 MG PO TABS: 5.0000 mg | ORAL_TABLET | Freq: Three times a day (TID) | ORAL | Status: AC

## 2013-06-27 MED ORDER — DIAZEPAM 5 MG PO TABS: 5.0000 mg | ORAL_TABLET | Freq: Three times a day (TID) | ORAL | Status: DC

## 2013-06-27 MED ORDER — PREDNISONE 20 MG PO TABS
60.0000 mg | ORAL_TABLET | Freq: Once | ORAL | Status: AC
  Administered 2013-06-27: 60 mg via ORAL
  Filled 2013-06-27: qty 3

## 2013-06-27 MED ORDER — DIAZEPAM 5 MG PO TABS
5.0000 mg | ORAL_TABLET | Freq: Once | ORAL | Status: AC
  Administered 2013-06-27: 5 mg via ORAL
  Filled 2013-06-27: qty 1

## 2013-06-27 MED ORDER — HYDROCODONE-ACETAMINOPHEN 5-325 MG PO TABS: 1.0000 | ORAL_TABLET | Freq: Four times a day (QID) | ORAL | Status: AC | PRN

## 2013-06-27 NOTE — ED Notes (Signed)
Patient with lower back pain, patient states that this has been going on for over two weeks.  Patient states that the pain radiates into his right hip and into groin.  Patient denies any urinary symptoms.

## 2013-06-27 NOTE — Discharge Instructions (Signed)
Back Exercises These exercises may help you when beginning to rehabilitate your injury. Your symptoms may resolve with or without further involvement from your physician, physical therapist or athletic trainer. While completing these exercises, remember:   Restoring tissue flexibility helps normal motion to return to the joints. This allows healthier, less painful movement and activity.  An effective stretch should be held for at least 30 seconds.  A stretch should never be painful. You should only feel a gentle lengthening or release in the stretched tissue. STRETCH - Extension, Prone on Elbows   Lie on your stomach on the floor, a bed will be too soft. Place your palms about shoulder width apart and at the height of your head.  Place your elbows under your shoulders. If this is too painful, stack pillows under your chest.  Allow your body to relax so that your hips drop lower and make contact more completely with the floor.  Hold this position for __________ seconds.  Slowly return to lying flat on the floor. Repeat __________ times. Complete this exercise __________ times per day.  RANGE OF MOTION - Extension, Prone Press Ups   Lie on your stomach on the floor, a bed will be too soft. Place your palms about shoulder width apart and at the height of your head.  Keeping your back as relaxed as possible, slowly straighten your elbows while keeping your hips on the floor. You may adjust the placement of your hands to maximize your comfort. As you gain motion, your hands will come more underneath your shoulders.  Hold this position __________ seconds.  Slowly return to lying flat on the floor. Repeat __________ times. Complete this exercise __________ times per day.  RANGE OF MOTION- Quadruped, Neutral Spine   Assume a hands and knees position on a firm surface. Keep your hands under your shoulders and your knees under your hips. You may place padding under your knees for  comfort.  Drop your head and point your tail bone toward the ground below you. This will round out your low back like an angry cat. Hold this position for __________ seconds.  Slowly lift your head and release your tail bone so that your back sags into a large arch, like an old horse.  Hold this position for __________ seconds.  Repeat this until you feel limber in your low back.  Now, find your "sweet spot." This will be the most comfortable position somewhere between the two previous positions. This is your neutral spine. Once you have found this position, tense your stomach muscles to support your low back.  Hold this position for __________ seconds. Repeat __________ times. Complete this exercise __________ times per day.  STRETCH - Flexion, Single Knee to Chest   Lie on a firm bed or floor with both legs extended in front of you.  Keeping one leg in contact with the floor, bring your opposite knee to your chest. Hold your leg in place by either grabbing behind your thigh or at your knee.  Pull until you feel a gentle stretch in your low back. Hold __________ seconds.  Slowly release your grasp and repeat the exercise with the opposite side. Repeat __________ times. Complete this exercise __________ times per day.  STRETCH - Hamstrings, Standing  Stand or sit and extend your right / left leg, placing your foot on a chair or foot stool  Keeping a slight arch in your low back and your hips straight forward.  Lead with your chest and   lean forward at the waist until you feel a gentle stretch in the back of your right / left knee or thigh. (When done correctly, this exercise requires leaning only a small distance.)  Hold this position for __________ seconds. Repeat __________ times. Complete this stretch __________ times per day. STRENGTHENING - Deep Abdominals, Pelvic Tilt   Lie on a firm bed or floor. Keeping your legs in front of you, bend your knees so they are both pointed  toward the ceiling and your feet are flat on the floor.  Tense your lower abdominal muscles to press your low back into the floor. This motion will rotate your pelvis so that your tail bone is scooping upwards rather than pointing at your feet or into the floor.  With a gentle tension and even breathing, hold this position for __________ seconds. Repeat __________ times. Complete this exercise __________ times per day.  STRENGTHENING - Abdominals, Crunches   Lie on a firm bed or floor. Keeping your legs in front of you, bend your knees so they are both pointed toward the ceiling and your feet are flat on the floor. Cross your arms over your chest.  Slightly tip your chin down without bending your neck.  Tense your abdominals and slowly lift your trunk high enough to just clear your shoulder blades. Lifting higher can put excessive stress on the low back and does not further strengthen your abdominal muscles.  Control your return to the starting position. Repeat __________ times. Complete this exercise __________ times per day.  STRENGTHENING - Quadruped, Opposite UE/LE Lift   Assume a hands and knees position on a firm surface. Keep your hands under your shoulders and your knees under your hips. You may place padding under your knees for comfort.  Find your neutral spine and gently tense your abdominal muscles so that you can maintain this position. Your shoulders and hips should form a rectangle that is parallel with the floor and is not twisted.  Keeping your trunk steady, lift your right hand no higher than your shoulder and then your left leg no higher than your hip. Make sure you are not holding your breath. Hold this position __________ seconds.  Continuing to keep your abdominal muscles tense and your back steady, slowly return to your starting position. Repeat with the opposite arm and leg. Repeat __________ times. Complete this exercise __________ times per day. Document Released:  01/13/2005 Document Revised: 03/20/2011 Document Reviewed: 04/09/2008 ExitCare Patient Information 2015 [employer]. This information is not intended to replace advice given to you by your health care provider. Make sure you discuss any questions you have with your health care provider.  

## 2013-06-27 NOTE — ED Provider Notes (Signed)
CSN: [id number]     Arrival date & time 06/27/13  1915 History   First MD Initiated Contact with Patient 06/27/13 1954     Chief Complaint  Patient presents with  . Back Pain     (Consider location/radiation/quality/duration/timing/severity/associated sxs/prior Treatment) HPI Comments: Patient lifted a heavy car battery leaning forward to placement in the trunk, approximately 2 weeks, ago, later that afternoon.  He noticed he was having right-sided low back pain.  That is progressively gotten worse over a three-day period and is been consistent ever since, then for the last 2, weeks.  He, has tried taking over-the-counter Advil.  He had some leftover Celebrex.  He had some leftover muscle relaxer.  He had some leftover Neurontin from a previous injury that he took at various times during this two-week period without relief of his pain.  Denies any loss of bowel or bladder continence.  He does not report decrease in ability to ambulate, but he does report that in certain positions.  Certain movements his pain increases.  Patient is a 43 y.o. male presenting with back pain. The history is provided by the patient.  Back Pain Location:  Lumbar spine Quality:  Aching Radiates to:  R posterior upper leg (groin) Pain severity:  Moderate Pain is:  Same all the time Onset quality:  Gradual Duration:  14 days Timing:  Constant Chronicity:  New Context: lifting heavy objects   Relieved by:  Nothing Exacerbated by: certain positions. Ineffective treatments:  NSAIDs Associated symptoms: no bladder incontinence, no bowel incontinence, no dysuria and no fever     Past Medical History  Diagnosis Date  . GERD (gastroesophageal reflux disease)   . Anxiety   . Complication of anesthesia   . PONV (postoperative nausea and vomiting)     reports was not NPO for full length   Past Surgical History  Procedure Laterality Date  . Appendectomy    . Open reduction internal fixation (orif) distal phalanx  Left 01/22/2013    Procedure: OPEN REDUCTION INTERNAL FIXATION (ORIF) DISTAL PHALANX;  Surgeon: Nadara MustardMarcus V Duda, MD;  Location: [facility] OR;  Service: Orthopedics;  Laterality: Left;  Open Reduction Internal Fixation Left Index Finger Middle Phalanx   History reviewed. No pertinent family history. History  Substance Use Topics  . Smoking status: Current Some Day Smoker -- 1.00 packs/day for 15 years  . Smokeless tobacco: Not on file  . Alcohol Use: Yes     Comment: occasionaly    Review of Systems  Constitutional: Negative for fever.  Gastrointestinal: Negative for bowel incontinence.  Genitourinary: Negative for bladder incontinence, dysuria, urgency and frequency.  Musculoskeletal: Positive for back pain.  Skin: Negative for rash and wound.  All other systems reviewed and are negative.     Allergies  Review of patient's allergies indicates no known allergies.  Home Medications   Prior to Admission medications   Medication Sig Start Date End Date Taking? Authorizing Provider  dexlansoprazole (DEXILANT) 60 MG capsule Take 60 mg by mouth daily.    Historical Provider, MD  diazepam (VALIUM) 2 MG tablet Take 2 mg by mouth daily.    Historical Provider, MD  diazepam (VALIUM) 5 MG tablet Take 1 tablet (5 mg total) by mouth 3 (three) times daily. 06/27/13   Arman FilterGail K Schulz, NP  HYDROcodone-acetaminophen (NORCO/VICODIN) 5-325 MG per tablet Take 1 tablet by mouth every 4 (four) hours as needed for moderate pain.    Historical Provider, MD  HYDROcodone-acetaminophen (NORCO/VICODIN) 5-325 MG per tablet  Take 1 tablet by mouth every 6 (six) hours as needed for moderate pain. 06/27/13   Arman FilterGail K Schulz, NP  ibuprofen (ADVIL,MOTRIN) 200 MG tablet Take 400 mg by mouth every 6 (six) hours as needed.    Historical Provider, MD  mometasone (NASONEX) 50 MCG/ACT nasal spray Place 2 sprays into the nose daily.    Historical Provider, MD  PARoxetine (PAXIL) 10 MG tablet Take 10 mg by mouth every evening.     Historical Provider, MD  predniSONE (DELTASONE) 20 MG tablet Take 2 tablets (40 mg total) by mouth daily with breakfast. 06/27/13   Arman FilterGail K Schulz, NP  ranitidine (ZANTAC) 150 MG tablet Take 150 mg by mouth daily as needed for heartburn.    Historical Provider, MD   BP 117/73  Pulse 56  Temp(Src) 98.5 F (36.9 C) (Oral)  Resp 13  Ht 5\' 10"  (1.778 m)  Wt 228 lb (103.42 kg)  BMI 32.71 kg/m2  SpO2 100% Physical Exam  Nursing note and vitals reviewed. Constitutional: He appears well-developed and well-nourished.  HENT:  Head: Normocephalic.  Eyes: Pupils are equal, round, and reactive to light.  Neck: Normal range of motion.  Cardiovascular: Normal rate and regular rhythm.   Pulmonary/Chest: Effort normal and breath sounds normal.  Abdominal: Soft.  Musculoskeletal: He exhibits edema and tenderness.       Back:  Pain worse, with straight leg raise on the left, but pain is on the right  Skin: Skin is warm.    ED Course  Procedures (including critical care time) Labs Review Labs Reviewed - No data to display  Imaging Review No results found.   EKG Interpretation None      MDM   Final diagnoses:  Right-sided low back pain without sciatica         Arman FilterGail K Schulz, NP 06/27/13 2034

## 2013-07-09 NOTE — ED Provider Notes (Signed)
Medical screening examination/treatment/procedure(s) were performed by non-physician practitioner and as supervising physician I was immediately available for consultation/collaboration.   EKG Interpretation None        Vanetta MuldersScott Langdon Crosson, MD 07/09/13 2203

## 2019-01-04 ENCOUNTER — Emergency Department (HOSPITAL_COMMUNITY): Payer: BLUE CROSS/BLUE SHIELD

## 2019-01-04 ENCOUNTER — Other Ambulatory Visit: Payer: Self-pay

## 2019-01-04 ENCOUNTER — Emergency Department (HOSPITAL_COMMUNITY)
Admission: EM | Admit: 2019-01-04 | Discharge: 2019-01-04 | Disposition: A | Discharge status: Home / Self Care | Payer: BLUE CROSS/BLUE SHIELD | Attending: Emergency Medicine | Admitting: Emergency Medicine

## 2019-01-04 ENCOUNTER — Encounter (HOSPITAL_COMMUNITY): Payer: Self-pay

## 2019-01-04 DIAGNOSIS — Y990 Civilian activity done for income or pay: Secondary | ICD-10-CM | POA: Insufficient documentation

## 2019-01-04 DIAGNOSIS — Y92410 Unspecified street and highway as the place of occurrence of the external cause: Secondary | ICD-10-CM | POA: Insufficient documentation

## 2019-01-04 DIAGNOSIS — Y9389 Activity, other specified: Secondary | ICD-10-CM | POA: Diagnosis not present

## 2019-01-04 DIAGNOSIS — S39012A Strain of muscle, fascia and tendon of lower back, initial encounter: Secondary | ICD-10-CM

## 2019-01-04 DIAGNOSIS — S161XXA Strain of muscle, fascia and tendon at neck level, initial encounter: Secondary | ICD-10-CM

## 2019-01-04 DIAGNOSIS — F17210 Nicotine dependence, cigarettes, uncomplicated: Secondary | ICD-10-CM | POA: Insufficient documentation

## 2019-01-04 DIAGNOSIS — S199XXA Unspecified injury of neck, initial encounter: Secondary | ICD-10-CM | POA: Diagnosis present

## 2019-01-04 DIAGNOSIS — T148XXA Other injury of unspecified body region, initial encounter: Secondary | ICD-10-CM | POA: Diagnosis not present

## 2019-01-04 DIAGNOSIS — Z79899 Other long term (current) drug therapy: Secondary | ICD-10-CM | POA: Diagnosis not present

## 2019-01-04 DIAGNOSIS — T07XXXA Unspecified multiple injuries, initial encounter: Secondary | ICD-10-CM

## 2019-01-04 MED ORDER — NAPROXEN 500 MG PO TABS: 500.0000 mg | ORAL_TABLET | Freq: Two times a day (BID) | ORAL | 0 refills | Status: AC

## 2019-01-04 MED ORDER — CYCLOBENZAPRINE HCL 10 MG PO TABS
5.0000 mg | ORAL_TABLET | Freq: Once | ORAL | Status: AC
  Administered 2019-01-04: 20:00:00 5 mg via ORAL
  Filled 2019-01-04: qty 1

## 2019-01-04 MED ORDER — HYDROCODONE-ACETAMINOPHEN 5-325 MG PO TABS
1.0000 | ORAL_TABLET | Freq: Once | ORAL | Status: AC
  Administered 2019-01-04: 21:00:00 1 via ORAL
  Filled 2019-01-04: qty 1

## 2019-01-04 MED ORDER — CYCLOBENZAPRINE HCL 10 MG PO TABS: 10.0000 mg | ORAL_TABLET | Freq: Two times a day (BID) | ORAL | 0 refills | Status: AC | PRN

## 2019-01-04 MED ORDER — NAPROXEN 250 MG PO TABS
500.0000 mg | ORAL_TABLET | Freq: Once | ORAL | Status: AC
  Administered 2019-01-04: 20:00:00 500 mg via ORAL
  Filled 2019-01-04: qty 2
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
## 2019-01-04 NOTE — ED Notes (Signed)
Patient transported to CT 
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
## 2019-01-04 NOTE — ED Notes (Signed)
Pt refused v/s..

## 2019-01-04 NOTE — ED Notes (Signed)
Pt able to move all extremities, pt able to walk
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
